# Patient Record
Sex: Male | Born: 1970
Health system: Southern US, Community
[De-identification: ages and names within clinical notes are randomized; demographics above are authoritative.]

---

## 2013-09-08 ENCOUNTER — Emergency Department (HOSPITAL_COMMUNITY)
Admission: EM | Admit: 2013-09-08 | Discharge: 2013-09-08 | Disposition: A | Discharge status: Home / Self Care | Payer: 59 | Source: Home / Self Care | Attending: Emergency Medicine | Admitting: Emergency Medicine

## 2013-09-08 ENCOUNTER — Emergency Department (INDEPENDENT_AMBULATORY_CARE_PROVIDER_SITE_OTHER): Payer: 59

## 2013-09-08 ENCOUNTER — Encounter (HOSPITAL_COMMUNITY): Payer: Self-pay | Admitting: Emergency Medicine

## 2013-09-08 DIAGNOSIS — J069 Acute upper respiratory infection, unspecified: Secondary | ICD-10-CM

## 2013-09-08 DIAGNOSIS — R030 Elevated blood-pressure reading, without diagnosis of hypertension: Secondary | ICD-10-CM

## 2013-09-08 DIAGNOSIS — IMO0001 Reserved for inherently not codable concepts without codable children: Secondary | ICD-10-CM

## 2013-09-08 MED ORDER — CETIRIZINE HCL 10 MG PO TABS: 10.0000 mg | ORAL_TABLET | Freq: Every day | ORAL | Status: AC

## 2013-09-08 MED ORDER — IPRATROPIUM BROMIDE 0.06 % NA SOLN: 2.0000 | Freq: Four times a day (QID) | NASAL | Status: AC

## 2013-09-08 MED ORDER — BENZONATATE 100 MG PO CAPS: 100.0000 mg | ORAL_CAPSULE | Freq: Three times a day (TID) | ORAL | Status: AC | PRN

## 2013-09-08 MED ORDER — PREDNISONE 10 MG PO TABS: 20.0000 mg | ORAL_TABLET | Freq: Every day | ORAL | Status: AC

## 2013-09-08 NOTE — ED Provider Notes (Signed)
Medical screening examination/treatment/procedure(s) were performed by non-physician practitioner and as supervising physician I was immediately available for consultation/collaboration.  Philipp Deputy, M.D.  Harden Mo, MD 09/08/13 2154

## 2013-09-08 NOTE — ED Provider Notes (Signed)
CSN: [id number]     Arrival date & time 09/08/13  1208 History   First MD Initiated Contact with Patient 09/08/13 1326     Chief Complaint  Patient presents with  . URI   (Consider location/radiation/quality/duration/timing/severity/associated sxs/prior Treatment) Patient is a 43 y.o. male presenting with URI. The history is provided by the patient.  URI Presenting symptoms: congestion, cough and rhinorrhea   Presenting symptoms: no ear pain, no fever and no sore throat   Presenting symptoms comment:  Nasal congestion and right ear congestion Severity:  Mild Onset quality:  Gradual Duration:  1 week Timing:  Constant Progression:  Unchanged Chronicity:  New Ineffective treatments:  OTC medications Associated symptoms: no arthralgias, no headaches, no myalgias, no neck pain, no sinus pain, no sneezing, no swollen glands and no wheezing   Risk factors: no immunosuppression, no recent illness, no recent travel and no sick contacts     History reviewed. No pertinent past medical history. No past surgical history on file. No family history on file. History  Substance Use Topics  . Smoking status: Not on file  . Smokeless tobacco: Not on file  . Alcohol Use: Not on file    Review of Systems  Constitutional: Negative for fever and chills.  HENT: Positive for congestion, postnasal drip, rhinorrhea and sinus pressure. Negative for ear pain, mouth sores, sneezing and sore throat.   Eyes: Negative.   Respiratory: Positive for cough. Negative for chest tightness, shortness of breath and wheezing.   Cardiovascular: Negative.   Gastrointestinal: Negative.   Genitourinary: Negative.   Musculoskeletal: Negative for arthralgias, myalgias and neck pain.  Skin: Negative.   Neurological: Negative for dizziness, weakness and headaches.    Allergies  Review of patient's allergies indicates no known allergies.  Home Medications   Prior to Admission medications   Medication Sig Start  Date End Date Taking? Authorizing Provider  benzonatate (TESSALON) 100 MG capsule Take 1 capsule (100 mg total) by mouth 3 (three) times daily as needed for cough. 09/08/13   Lahoma Rocker, PA  cetirizine (ZYRTEC) 10 MG tablet Take 1 tablet (10 mg total) by mouth daily. 09/08/13   Lahoma Rocker, PA  ipratropium (ATROVENT) 0.06 % nasal spray Place 2 sprays into both nostrils 4 (four) times daily. For nasal congestion 09/08/13   Lahoma Rocker, PA  predniSONE (DELTASONE) 10 MG tablet Take 2 tablets (20 mg total) by mouth daily. X 3 days 09/08/13   Annett Gula Presson, PA   BP 143/100  Pulse 74  Temp(Src) 99.1 F (37.3 C) (Oral)  Resp 14  SpO2 99% Physical Exam  Nursing note and vitals reviewed. Constitutional: He is oriented to person, place, and time. He appears well-developed and well-nourished. No distress.  HENT:  Head: Normocephalic and atraumatic.  Right Ear: Hearing, external ear and ear canal normal. No drainage, swelling or tenderness. No mastoid tenderness. Tympanic membrane is not injected, not erythematous, not retracted and not bulging. A middle ear effusion is present. No hemotympanum.  Left Ear: Hearing, external ear and ear canal normal. No drainage, swelling or tenderness. No mastoid tenderness. Tympanic membrane is not injected, not erythematous, not retracted and not bulging. A middle ear effusion is present. No hemotympanum.  Nose: Nose normal.  Mouth/Throat: Uvula is midline, oropharynx is clear and moist and mucous membranes are normal.  Trace of clear fluid behind each TM  Eyes: Conjunctivae are normal. Right eye exhibits no discharge. Left eye exhibits no discharge. No scleral icterus.  Neck: Normal range of motion. Neck supple.  Cardiovascular: Normal rate, regular rhythm and normal heart sounds.   Pulmonary/Chest: Effort normal and breath sounds normal. No respiratory distress. He has no wheezes.  Musculoskeletal: Normal range of motion.   Lymphadenopathy:    He has no cervical adenopathy.  Neurological: He is alert and oriented to person, place, and time.  Skin: Skin is warm and dry.  Psychiatric: He has a normal mood and affect. His behavior is normal.    ED Course  Procedures (including critical care time) Labs Review Labs Reviewed - No data to display  Imaging Review Dg Chest 2 View  09/08/2013   CLINICAL DATA:  Cough and congestion.  EXAM: CHEST  2 VIEW  COMPARISON:  Report 07/11/2009  FINDINGS: The heart size is normal. The lungs are mildly hyperinflated. No focal airspace disease is evident. The visualized soft tissues and bony thorax are unremarkable.  IMPRESSION: 1. Mild hyperinflation. This may be within normal limits. Reactive airways disease is also considered. 2. No focal airspace disease.   Electronically Signed   By: Lawrence Santiago M.D.   On: 09/08/2013 14:12     MDM   1. Elevated blood pressure   2. URI (upper respiratory infection)   CXR normal. Exam suggests viral URI with mild serous otitis bilaterally. Explained to patient that findings on exam and CXR do not indicate need for antibiotics and that he is simply experiencing a slightly prolonged recovery phase. Made him aware of his elevated blood pressure and the need for PCP follow up and to discontinue oral decongestants. Will provide Rx for tessalon, atrovent nasal spray, zyrtec and prednisone to help with inflammatory response to recent illness and advise follow up if no improvement over next 7-10 days.     [place], [place] 09/08/13 1531

## 2013-09-08 NOTE — ED Notes (Signed)
C/o cough and congestion for a week now States he used OTC medication States he thinks it is a sinus infection due to a cold

## 2013-09-08 NOTE — Discharge Instructions (Signed)
Your chest xray was normal and your exam does not suggest a bacterial infection or the need for antibiotics. Your chest xray was normal. Your blood pressure is quite elevated today (143/100 & 156/101) and you should discontinue the use of any oral over the counter decongestants as these medications will cause additional elevation of your blood pressure. Please contact your doctor for follow up evaluation with regard to your blood pressure. You should have this re-checked in 7-10 days. You are likely experiencing a prolonged recovery from a viral upper respiratory infection and you should use medications as prescribed to help treat your symptoms.   Antibiotic Nonuse  Your caregiver felt that the infection or problem was not one that would be helped with an antibiotic. Infections may be caused by viruses or bacteria. Only a caregiver can tell which one of these is the likely cause of an illness. A cold is the most common cause of infection in both adults and children. A cold is a virus. Antibiotic treatment will have no effect on a viral infection. Viruses can lead to many lost days of work caring for sick children and many missed days of school. Children may catch as many as 10 "colds" or "flus" per year during which they can be tearful, cranky, and uncomfortable. The goal of treating a virus is aimed at keeping the ill person comfortable. Antibiotics are medications used to help the body fight bacterial infections. There are relatively few types of bacteria that cause infections but there are hundreds of viruses. While both viruses and bacteria cause infection they are very different types of germs. A viral infection will typically go away by itself within 7 to 10 days. Bacterial infections may spread or get worse without antibiotic treatment. Examples of bacterial infections are:  Sore throats (like strep throat or tonsillitis).  Infection in the lung (pneumonia).  Ear and skin infections. Examples of  viral infections are:  Colds or flus.  Most coughs and bronchitis.  Sore throats not caused by Strep.  Runny noses. It is often best not to take an antibiotic when a viral infection is the cause of the problem. Antibiotics can kill off the helpful bacteria that we have inside our body and allow harmful bacteria to start growing. Antibiotics can cause side effects such as allergies, nausea, and diarrhea without helping to improve the symptoms of the viral infection. Additionally, repeated uses of antibiotics can cause bacteria inside of our body to become resistant. That resistance can be passed onto harmful bacterial. The next time you have an infection it may be harder to treat if antibiotics are used when they are not needed. Not treating with antibiotics allows our own immune system to develop and take care of infections more efficiently. Also, antibiotics will work better for [place] when they are prescribed for bacterial infections. Treatments for a child that is ill may include:  Give extra fluids throughout the day to stay hydrated.  Get plenty of rest.  Only give your child over-the-counter or prescription medicines for pain, discomfort, or fever as directed by your caregiver.  The use of a cool mist humidifier may help stuffy noses.  Cold medications if suggested by your caregiver. Your caregiver may decide to start you on an antibiotic if:  The problem you were seen for today continues for a longer length of time than expected.  You develop a secondary bacterial infection. SEEK MEDICAL CARE IF:  Fever lasts longer than 5 days.  Symptoms continue to get  worse after 5 to 7 days or become severe.  Difficulty in breathing develops.  Signs of dehydration develop (poor drinking, rare urinating, dark colored urine).  Changes in behavior or worsening tiredness (listlessness or lethargy). Document Released: 05/25/2001 Document Revised: 06/08/2011 Document Reviewed:  11/21/2008 [facility] Patient Information 2014 [place], [place].  Blood Pressure Record Sheet Your blood pressure on this visit to the Emergency Department or Clinic is elevated. This does not necessarily mean you have hypertension, but it does mean that your blood pressure needs to be rechecked. Many times blood pressure increases because of illness, pain, anxiety, or other factors. We recommend that you get a series of blood pressure readings done over a period of about 5 days. It is best to get a reading in the morning and one in the evening. You should make sure you sit and relax for 1 to 5 minutes before the reading is taken. Write the readings down and make a follow-up appointment with your doctor to discuss the results. If there is not a free clinic or a drug store with blood pressure taking machines near you, you can purchase good blood pressure taking equipment from a drug store, often for less than the price of a physician's office call. Having one in the home also allows you the convenience of taking your blood pressure while you are home and in relaxed surroundings. Your blood pressure in the Emergency Department or Clinic on ________ was ____________________. BLOOD PRESSURE LOG Date: _______________________  a.m. _____________________  p.m. _____________________ Date: _______________________  a.m. _____________________  p.m. _____________________ Date: _______________________  a.m. _____________________  p.m. _____________________ Date: _______________________  a.m. _____________________  p.m. _____________________ Date: _______________________  a.m. _____________________  p.m. _____________________ Document Released: 12/13/2002 Document Revised: 06/08/2011 Document Reviewed: 03/16/2005 [employer] Patient Information 2014 [place], [place].  Hypertension As your heart beats, it forces blood through your arteries. This force is your blood pressure. If the pressure is too  high, it is called hypertension (HTN) or high blood pressure. HTN is dangerous because you may have it and not know it. High blood pressure may mean that your heart has to work harder to pump blood. Your arteries may be narrow or stiff. The extra work puts you at risk for heart disease, stroke, and other problems.  Blood pressure consists of two numbers, a higher number over a lower, 110/72, for example. It is stated as "110 over 72." The ideal is below 120 for the top number (systolic) and under 80 for the bottom (diastolic). Write down your blood pressure today. You should pay close attention to your blood pressure if you have certain conditions such as:  Heart failure.  Prior heart attack.  Diabetes  Chronic kidney disease.  Prior stroke.  Multiple risk factors for heart disease. To see if you have HTN, your blood pressure should be measured while you are seated with your arm held at the level of the heart. It should be measured at least twice. A one-time elevated blood pressure reading (especially in the Emergency Department) does not mean that you need treatment. There may be conditions in which the blood pressure is different between your right and left arms. It is important to see your caregiver soon for a recheck. Most people have essential hypertension which means that there is not a specific cause. This type of high blood pressure may be lowered by changing lifestyle factors such as:  Stress.  Smoking.  Lack of exercise.  Excessive weight.  Drug/tobacco/alcohol use.  Eating less salt.  Most people do not have symptoms from high blood pressure until it has caused damage to the body. Effective treatment can often prevent, delay or reduce that damage. TREATMENT  When a cause has been identified, treatment for high blood pressure is directed at the cause. There are a large number of medications to treat HTN. These fall into several categories, and your caregiver will help you  select the medicines that are best for you. Medications may have side effects. You should review side effects with your caregiver. If your blood pressure stays high after you have made lifestyle changes or started on medicines,   Your medication(s) may need to be changed.  Other problems may need to be addressed.  Be certain you understand your prescriptions, and know how and when to take your medicine.  Be sure to follow up with your caregiver within the time frame advised (usually within two weeks) to have your blood pressure rechecked and to review your medications.  If you are taking more than one medicine to lower your blood pressure, make sure you know how and at what times they should be taken. Taking two medicines at the same time can result in blood pressure that is too low. SEEK IMMEDIATE MEDICAL CARE IF:  You develop a severe headache, blurred or changing vision, or confusion.  You have unusual weakness or numbness, or a faint feeling.  You have severe chest or abdominal pain, vomiting, or breathing problems. MAKE SURE YOU:   Understand these instructions.  Will watch your condition.  Will get help right away if you are not doing well or get worse. Document Released: 03/16/2005 Document Revised: 06/08/2011 Document Reviewed: 11/04/2007 [facility] Patient Information 2014 [place].  Upper Respiratory Infection, Adult An upper respiratory infection (URI) is also sometimes known as the common cold. The upper respiratory tract includes the nose, sinuses, throat, trachea, and bronchi. Bronchi are the airways leading to the lungs. Most people improve within 1 week, but symptoms can last up to 2 weeks. A residual cough may last even longer.  CAUSES Many different viruses can infect the tissues lining the upper respiratory tract. The tissues become irritated and inflamed and often become very moist. Mucus production is also common. A cold is contagious. You can easily spread  the virus to others by oral contact. This includes kissing, sharing a glass, coughing, or sneezing. Touching your mouth or nose and then touching a surface, which is then touched by another person, can also spread the virus. SYMPTOMS  Symptoms typically develop 1 to 3 days after you come in contact with a cold virus. Symptoms vary from person to person. They may include:  Runny nose.  Sneezing.  Nasal congestion.  Sinus irritation.  Sore throat.  Loss of voice (laryngitis).  Cough.  Fatigue.  Muscle aches.  Loss of appetite.  Headache.  Low-grade fever. DIAGNOSIS  You might diagnose your own cold based on familiar symptoms, since most people get a cold 2 to 3 times a year. Your caregiver can confirm this based on your exam. Most importantly, your caregiver can check that your symptoms are not due to another disease such as strep throat, sinusitis, pneumonia, asthma, or epiglottitis. Blood tests, throat tests, and X-rays are not necessary to diagnose a common cold, but they may sometimes be helpful in excluding other more serious diseases. Your caregiver will decide if any further tests are required. RISKS AND COMPLICATIONS  You may be at risk for a more severe case of the  common cold if you smoke cigarettes, have chronic heart disease (such as heart failure) or lung disease (such as asthma), or if you have a weakened immune system. The very young and very old are also at risk for more serious infections. Bacterial sinusitis, middle ear infections, and bacterial pneumonia can complicate the common cold. The common cold can worsen asthma and chronic obstructive pulmonary disease (COPD). Sometimes, these complications can require emergency medical care and may be life-threatening. PREVENTION  The best way to protect against getting a cold is to practice good hygiene. Avoid oral or hand contact with people with cold symptoms. Wash your hands often if contact occurs. There is no clear  evidence that vitamin C, vitamin E, echinacea, or exercise reduces the chance of developing a cold. However, it is always recommended to get plenty of rest and practice good nutrition. TREATMENT  Treatment is directed at relieving symptoms. There is no cure. Antibiotics are not effective, because the infection is caused by a virus, not by bacteria. Treatment may include:  Increased fluid intake. Sports drinks offer valuable electrolytes, sugars, and fluids.  Breathing heated mist or steam (vaporizer or shower).  Eating chicken soup or other clear broths, and maintaining good nutrition.  Getting plenty of rest.  Using gargles or lozenges for comfort.  Controlling fevers with ibuprofen or acetaminophen as directed by your caregiver.  Increasing usage of your inhaler if you have asthma. Zinc gel and zinc lozenges, taken in the first 24 hours of the common cold, can shorten the duration and lessen the severity of symptoms. Pain medicines may help with fever, muscle aches, and throat pain. A variety of non-prescription medicines are available to treat congestion and runny nose. Your caregiver can make recommendations and may suggest nasal or lung inhalers for other symptoms.  HOME CARE INSTRUCTIONS   Only take over-the-counter or prescription medicines for pain, discomfort, or fever as directed by your caregiver.  Use a warm mist humidifier or inhale steam from a shower to increase air moisture. This may keep secretions moist and make it easier to breathe.  Drink enough water and fluids to keep your urine clear or pale yellow.  Rest as needed.  Return to work when your temperature has returned to normal or as your caregiver advises. You may need to stay home longer to avoid infecting others. You can also use a face mask and careful hand washing to prevent spread of the virus. SEEK MEDICAL CARE IF:   After the first few days, you feel you are getting worse rather than better.  You need  your caregiver's advice about medicines to control symptoms.  You develop chills, worsening shortness of breath, or brown or red sputum. These may be signs of pneumonia.  You develop yellow or brown nasal discharge or pain in the face, especially when you bend forward. These may be signs of sinusitis.  You develop a fever, swollen neck glands, pain with swallowing, or white areas in the back of your throat. These may be signs of strep throat. SEEK IMMEDIATE MEDICAL CARE IF:   You have a fever.  You develop severe or persistent headache, ear pain, sinus pain, or chest pain.  You develop wheezing, a prolonged cough, cough up blood, or have a change in your usual mucus (if you have chronic lung disease).  You develop sore muscles or a stiff neck. Document Released: 09/09/2000 Document Revised: 06/08/2011 Document Reviewed: 07/18/2010 [facility] Patient Information 2014 [place], [place].

## 2014-04-26 ENCOUNTER — Other Ambulatory Visit (HOSPITAL_COMMUNITY): Payer: Self-pay | Admitting: Physician Assistant

## 2014-04-26 DIAGNOSIS — C6292 Malignant neoplasm of left testis, unspecified whether descended or undescended: Secondary | ICD-10-CM

## 2014-04-27 ENCOUNTER — Other Ambulatory Visit (HOSPITAL_COMMUNITY): Payer: Self-pay | Admitting: Physician Assistant

## 2014-04-27 DIAGNOSIS — C6292 Malignant neoplasm of left testis, unspecified whether descended or undescended: Secondary | ICD-10-CM

## 2014-05-09 ENCOUNTER — Ambulatory Visit (HOSPITAL_COMMUNITY): Payer: 59

## 2014-05-16 ENCOUNTER — Ambulatory Visit (HOSPITAL_COMMUNITY)
Admission: RE | Admit: 2014-05-16 | Discharge: 2014-05-16 | Disposition: A | Discharge status: Home / Self Care | Payer: 59 | Source: Ambulatory Visit | Attending: *Deleted | Admitting: *Deleted

## 2014-05-16 ENCOUNTER — Ambulatory Visit (HOSPITAL_COMMUNITY): Payer: 59

## 2014-05-16 ENCOUNTER — Ambulatory Visit (HOSPITAL_COMMUNITY)
Admission: RE | Admit: 2014-05-16 | Discharge: 2014-05-16 | Disposition: A | Discharge status: Home / Self Care | Payer: 59 | Source: Ambulatory Visit | Attending: Physician Assistant | Admitting: Physician Assistant

## 2014-05-16 DIAGNOSIS — C6292 Malignant neoplasm of left testis, unspecified whether descended or undescended: Secondary | ICD-10-CM | POA: Insufficient documentation

## 2014-05-16 DIAGNOSIS — Z9221 Personal history of antineoplastic chemotherapy: Secondary | ICD-10-CM | POA: Insufficient documentation

## 2014-05-16 MED ORDER — GADOBENATE DIMEGLUMINE 529 MG/ML IV SOLN
20.0000 mL | Freq: Once | INTRAVENOUS | Status: AC
  Administered 2014-05-16: 18 mL via INTRAVENOUS

## 2015-03-13 ENCOUNTER — Other Ambulatory Visit (HOSPITAL_COMMUNITY): Payer: Self-pay | Admitting: Physician Assistant

## 2015-03-13 DIAGNOSIS — C6292 Malignant neoplasm of left testis, unspecified whether descended or undescended: Secondary | ICD-10-CM

## 2015-03-27 ENCOUNTER — Ambulatory Visit (HOSPITAL_COMMUNITY): Payer: 59

## 2015-03-27 ENCOUNTER — Ambulatory Visit (HOSPITAL_COMMUNITY)
Admission: RE | Admit: 2015-03-27 | Discharge: 2015-03-27 | Disposition: A | Discharge status: Home / Self Care | Payer: 59 | Source: Ambulatory Visit | Attending: Physician Assistant | Admitting: Physician Assistant

## 2015-03-27 DIAGNOSIS — C6292 Malignant neoplasm of left testis, unspecified whether descended or undescended: Secondary | ICD-10-CM | POA: Insufficient documentation

## 2015-03-27 MED ORDER — GADOBENATE DIMEGLUMINE 529 MG/ML IV SOLN
20.0000 mL | Freq: Once | INTRAVENOUS | Status: AC | PRN
  Administered 2015-03-27: 18 mL via INTRAVENOUS

## 2015-04-02 DIAGNOSIS — C6292 Malignant neoplasm of left testis, unspecified whether descended or undescended: Secondary | ICD-10-CM | POA: Diagnosis not present

## 2015-04-02 DIAGNOSIS — Z9079 Acquired absence of other genital organ(s): Secondary | ICD-10-CM | POA: Diagnosis not present

## 2015-04-02 DIAGNOSIS — Z8547 Personal history of malignant neoplasm of testis: Secondary | ICD-10-CM | POA: Diagnosis not present

## 2015-04-02 DIAGNOSIS — Z08 Encounter for follow-up examination after completed treatment for malignant neoplasm: Secondary | ICD-10-CM | POA: Diagnosis not present

## 2016-04-02 DIAGNOSIS — I10 Essential (primary) hypertension: Secondary | ICD-10-CM | POA: Diagnosis not present

## 2016-04-02 DIAGNOSIS — Z08 Encounter for follow-up examination after completed treatment for malignant neoplasm: Secondary | ICD-10-CM | POA: Diagnosis not present

## 2016-04-02 DIAGNOSIS — Z9221 Personal history of antineoplastic chemotherapy: Secondary | ICD-10-CM | POA: Diagnosis not present

## 2016-04-02 DIAGNOSIS — Z8547 Personal history of malignant neoplasm of testis: Secondary | ICD-10-CM | POA: Diagnosis not present

## 2016-05-10 IMAGING — MR MR PELVIS WO/W CM
11 of 20 series · 20 of 48 positions shown · IV contrast (multihance)
Comparison: 05/16/2014

CLINICAL DATA: Followup left testicular seminoma. Previous left
orchiectomy and chemotherapy.

EXAM:
MRI ABDOMEN AND PELVIS WITHOUT AND WITH CONTRAST
TECHNIQUE: Multiplanar multisequence MR imaging of the abdomen and pelvis was
performed both before and after the administration of intravenous
contrast.
CONTRAST:  18mL MULTIHANCE GADOBENATE DIMEGLUMINE 529 MG/ML IV SOLN

[Series 2: cor ssfse pelvis · coronal · 6.0mm · 0.78mm/px · 1 of 31 slices shown]
[im 1/31]
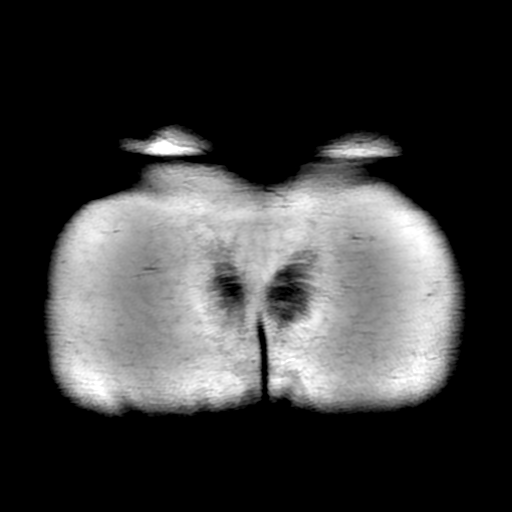

[Series 3: T2 · axial · 5.5mm · 1.25mm/px · 1 of 42 slices shown (1 of 2)]
[im 1/42]
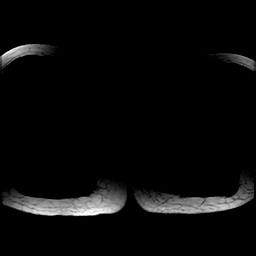

[Series 4: T2 · sagittal · 5.5mm · 1.17mm/px · 1 of 51 slices shown (2 of 2)]
[im 1/51]
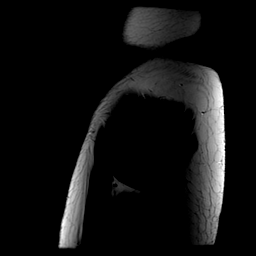

[Series 6: T1 fat-sat · axial · 5.5mm · 0.62mm/px · z∈[-299,-53]mm · 2 of 42 slices shown]
[im 1/42]
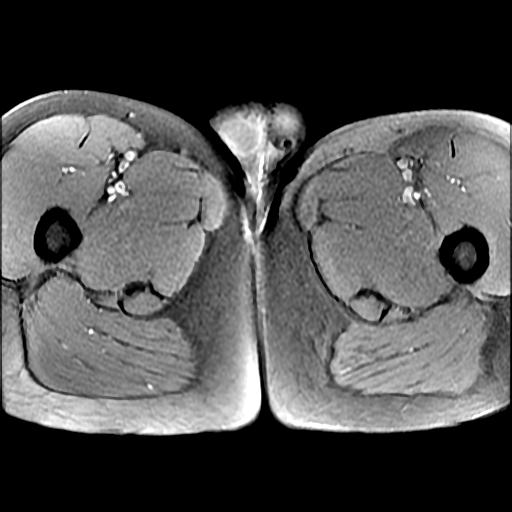
[im 42/42]
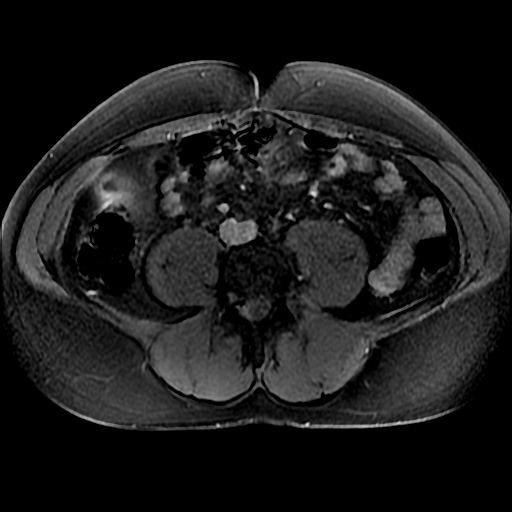

[Series 11: cor ssfse abd · coronal · 6.0mm · 0.78mm/px · 1 of 29 slices shown]
[im 1/29]
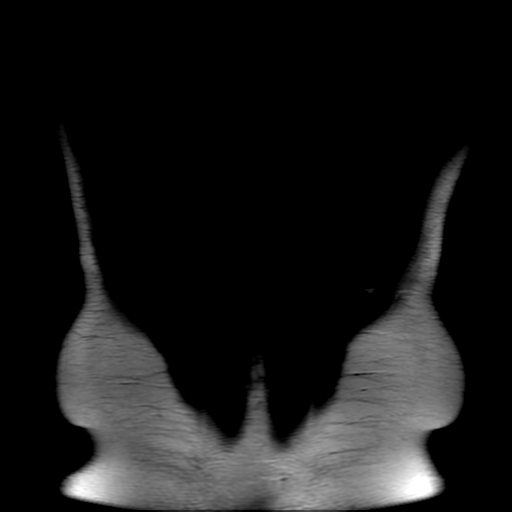

[Series 12: T2 fat-sat · axial · 8.0mm · 0.74mm/px · 1 of 26 slices shown]
[im 1/26]
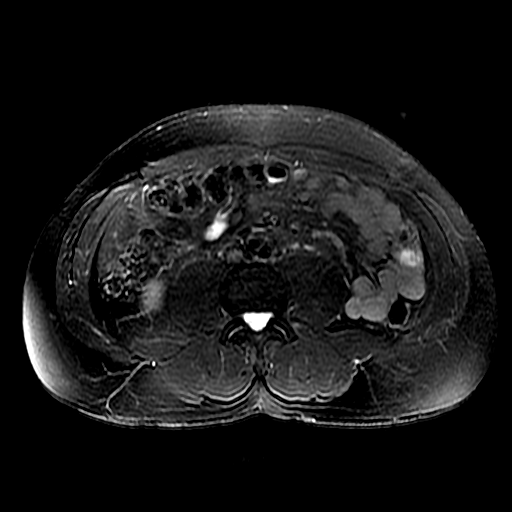

[Series 14: bSSFP · axial · 4.0mm · 0.74mm/px · z∈[-23,+189]mm · 3 of 54 slices shown]
[im 1/54]
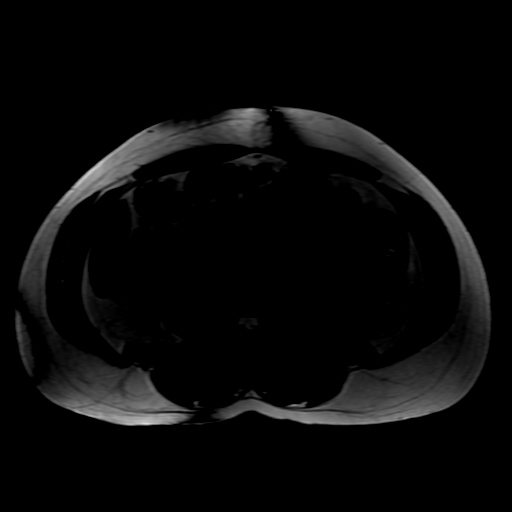
[im 27/54]
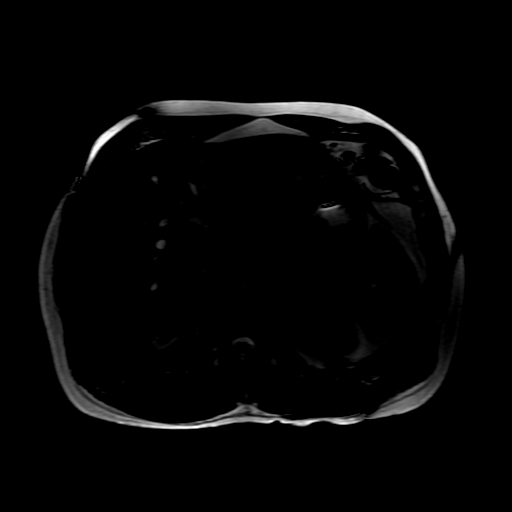
[im 54/54]
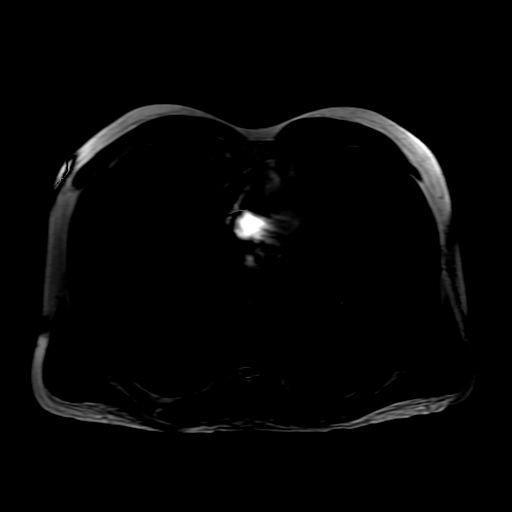

[Series 15: T1 dynamic · axial · 4.0mm · 0.74mm/px · z∈[-48,+188]mm · 3 of 60 slices shown]
[im 1/60]
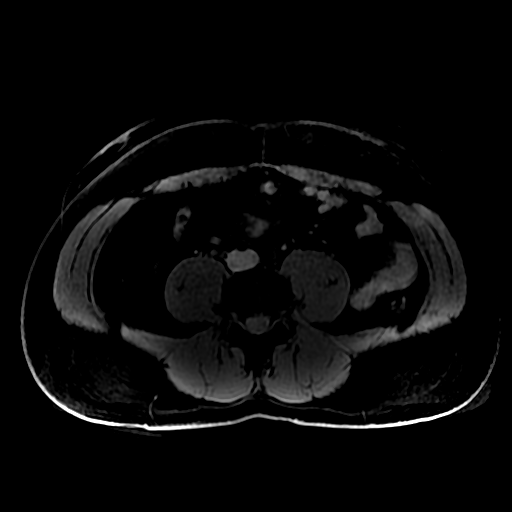
[im 30/60]
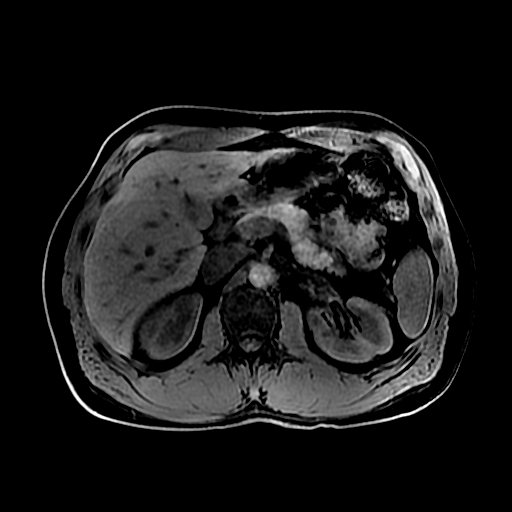
[im 60/60]
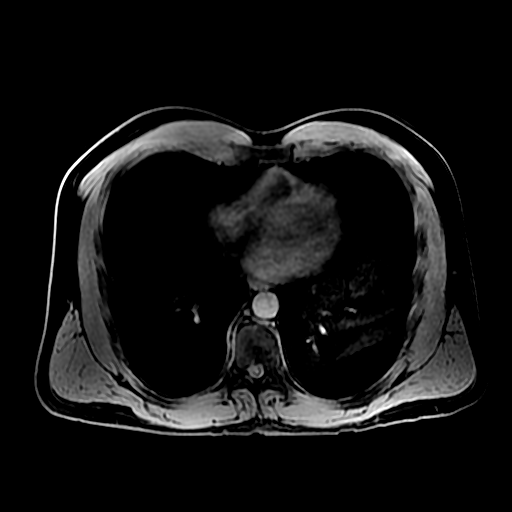

[Series 18: T1 fat-sat post-contrast · axial · 5.5mm · 0.62mm/px · z∈[-299,-53]mm · 2 of 42 slices shown]
[im 1/42]
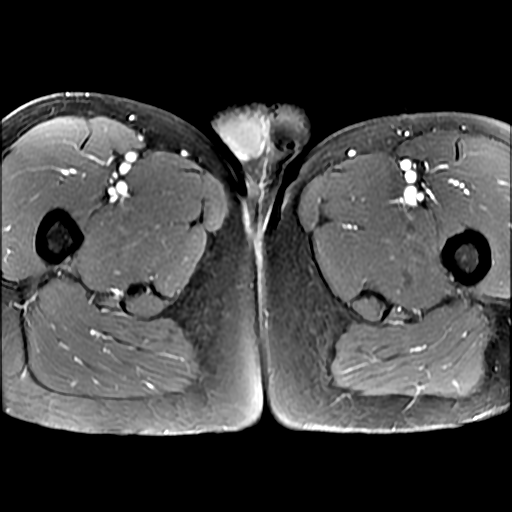
[im 42/42]
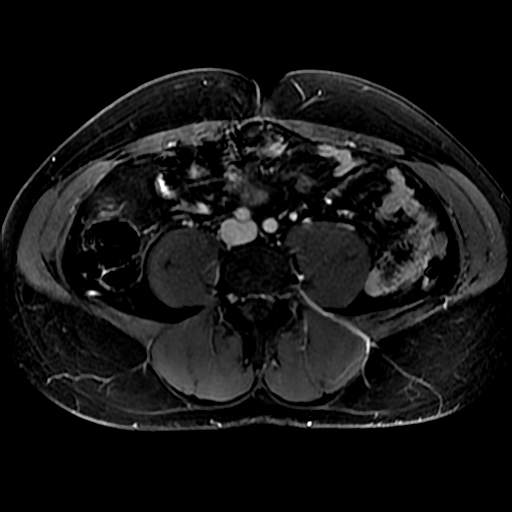

[Series 19: T1 dynamic post-contrast · axial · 4.0mm · 0.74mm/px · z∈[-48,+188]mm · 3 of 60 slices shown]
[im 1/60]
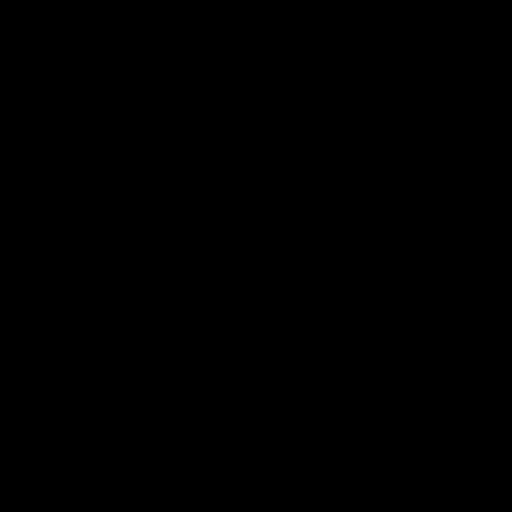
[im 30/60]
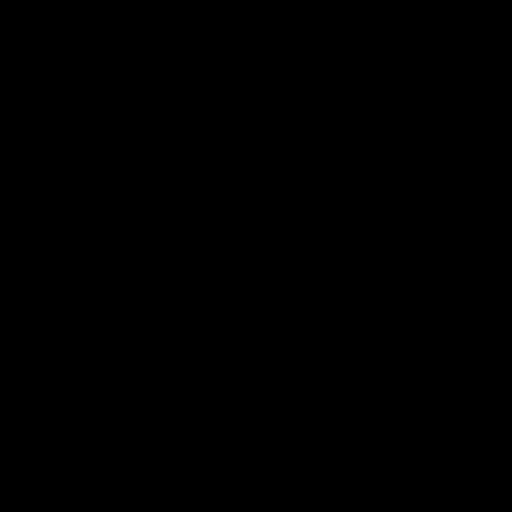
[im 60/60]
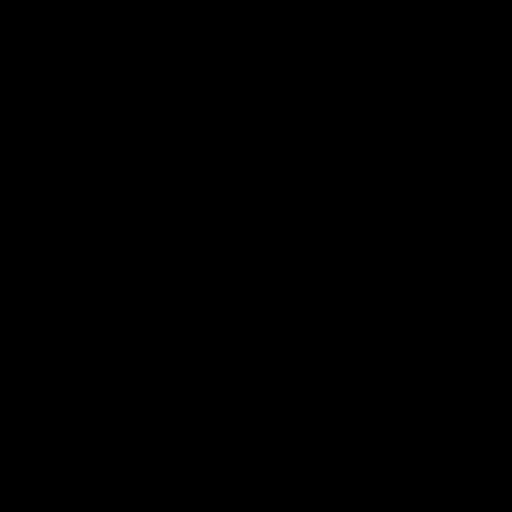

[Series 21: ax ssfse abd · axial · 4.0mm · 0.74mm/px · z∈[-14,+94]mm · 2 of 56 slices shown]
[im 1/56]
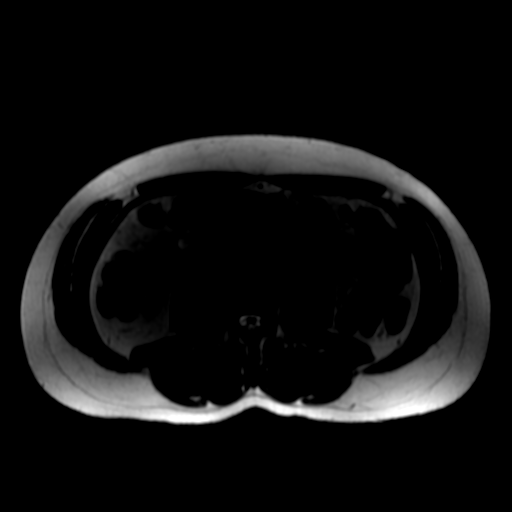
[im 28/56]
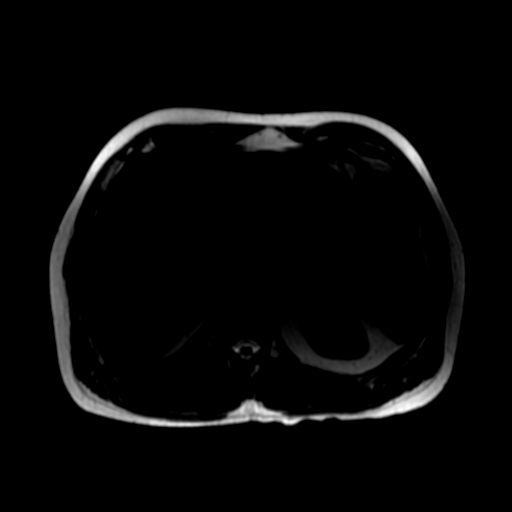

[20 of 48 positions shown; findings below may reference images not displayed]

FINDINGS: MRI ABDOMEN FINDINGS

Lower chest:  No acute findings.

Hepatobiliary: No mass or other parenchymal abnormality identified.
Gallbladder is unremarkable.

Pancreas: No mass, inflammatory changes, or other parenchymal
abnormality identified.

Spleen:  Within normal limits in size and appearance.

Adrenals/Urinary Tract: No masses identified. A tiny simple right
renal cyst again noted. No evidence of hydronephrosis.

Stomach/Bowel: Visualized portions within the abdomen are
unremarkable.

Vascular/Lymphatic: No pathologically enlarged lymph nodes
identified in the retroperitoneum or elsewhere within the abdomen.
No abdominal aortic aneurysm demonstrated.

Other:  None.

Musculoskeletal:  No suspicious bone lesions identified.

MRI PELVIS FINDINGS

Prior left orchiectomy and left testicular prosthesis again noted.
No evidence of pelvic lymphadenopathy or other soft tissue masses.

Prostate and urinary bladder are unremarkable in appearance. Pelvic
bowel loops are also unremarkable. No evidence of inflammatory
process or abnormal fluid collections. No suspicious pelvic bone
lesions identified.
IMPRESSION: Stable exam. No evidence of metastatic disease or other significant
abnormality within the abdomen or pelvis.

## 2016-06-01 DIAGNOSIS — R000 Tachycardia, unspecified: Secondary | ICD-10-CM | POA: Diagnosis not present

## 2016-06-01 DIAGNOSIS — Z23 Encounter for immunization: Secondary | ICD-10-CM | POA: Diagnosis not present

## 2016-06-01 DIAGNOSIS — I10 Essential (primary) hypertension: Secondary | ICD-10-CM | POA: Diagnosis not present

## 2016-06-01 DIAGNOSIS — Z8547 Personal history of malignant neoplasm of testis: Secondary | ICD-10-CM | POA: Diagnosis not present

## 2016-06-15 DIAGNOSIS — B349 Viral infection, unspecified: Secondary | ICD-10-CM | POA: Diagnosis not present

## 2016-06-15 DIAGNOSIS — J029 Acute pharyngitis, unspecified: Secondary | ICD-10-CM | POA: Diagnosis not present

## 2016-08-03 DIAGNOSIS — I10 Essential (primary) hypertension: Secondary | ICD-10-CM | POA: Diagnosis not present

## 2016-08-03 DIAGNOSIS — R000 Tachycardia, unspecified: Secondary | ICD-10-CM | POA: Diagnosis not present

## 2016-08-10 DIAGNOSIS — I10 Essential (primary) hypertension: Secondary | ICD-10-CM | POA: Diagnosis not present

## 2016-10-07 MED FILL — LISINOPRIL 10 MG TABLET: 10 | 90 days supply | Qty: 90 | Fill #0

## 2017-01-12 MED FILL — LISINOPRIL 10 MG TABS: 10 | 90 days supply | Qty: 90 | Fill #1

## 2017-04-06 DIAGNOSIS — Z79899 Other long term (current) drug therapy: Secondary | ICD-10-CM | POA: Diagnosis not present

## 2017-04-06 DIAGNOSIS — Z8547 Personal history of malignant neoplasm of testis: Secondary | ICD-10-CM | POA: Diagnosis not present

## 2017-04-06 DIAGNOSIS — Z09 Encounter for follow-up examination after completed treatment for conditions other than malignant neoplasm: Secondary | ICD-10-CM | POA: Diagnosis not present

## 2017-04-06 DIAGNOSIS — I10 Essential (primary) hypertension: Secondary | ICD-10-CM | POA: Diagnosis not present

## 2017-04-20 MED FILL — LISINOPRIL 10 MG TABS: 10 | 90 days supply | Qty: 90 | Fill #2

## 2017-04-22 DIAGNOSIS — I10 Essential (primary) hypertension: Secondary | ICD-10-CM | POA: Diagnosis not present

## 2017-04-27 DIAGNOSIS — Z0000 Encounter for general adult medical examination without abnormal findings: Secondary | ICD-10-CM | POA: Diagnosis not present

## 2017-06-01 DIAGNOSIS — L821 Other seborrheic keratosis: Secondary | ICD-10-CM | POA: Diagnosis not present

## 2017-06-22 DIAGNOSIS — J029 Acute pharyngitis, unspecified: Secondary | ICD-10-CM | POA: Diagnosis not present

## 2017-06-22 DIAGNOSIS — R0982 Postnasal drip: Secondary | ICD-10-CM | POA: Diagnosis not present

## 2017-06-28 DIAGNOSIS — Z8547 Personal history of malignant neoplasm of testis: Secondary | ICD-10-CM | POA: Diagnosis not present

## 2017-07-21 MED FILL — LISINOPRIL 10 MG TABLET: 10 | 90 days supply | Qty: 90 | Fill #3

## 2017-10-15 MED FILL — LISINOPRIL 10 MG TABLET: 10 | 30 days supply | Qty: 30 | Fill #0

## 2017-11-08 MED FILL — LISINOPRIL 10 MG TABLET: 10 | 90 days supply | Qty: 90 | Fill #0

## 2018-02-09 MED FILL — LISINOPRIL 10 MG TABS: 10 | 90 days supply | Qty: 90 | Fill #1

## 2018-03-25 DIAGNOSIS — Z8547 Personal history of malignant neoplasm of testis: Secondary | ICD-10-CM | POA: Diagnosis not present

## 2018-04-05 DIAGNOSIS — Z8547 Personal history of malignant neoplasm of testis: Secondary | ICD-10-CM | POA: Diagnosis not present

## 2018-04-05 DIAGNOSIS — I10 Essential (primary) hypertension: Secondary | ICD-10-CM | POA: Diagnosis not present

## 2018-05-04 DIAGNOSIS — C6292 Malignant neoplasm of left testis, unspecified whether descended or undescended: Secondary | ICD-10-CM | POA: Diagnosis not present

## 2018-05-18 MED FILL — LISINOPRIL 10 MG TABS: 10 | 90 days supply | Qty: 90 | Fill #0 | Status: TO

## 2018-05-25 DIAGNOSIS — Z0000 Encounter for general adult medical examination without abnormal findings: Secondary | ICD-10-CM | POA: Diagnosis not present

## 2018-07-27 MED FILL — LISINOPRIL 10 MG TABLET: 10 | 90 days supply | Qty: 90 | Fill #0

## 2018-10-21 MED FILL — LISINOPRIL 10 MG TABS: 10 | 90 days supply | Qty: 90 | Fill #0

## 2019-02-02 MED FILL — LISINOPRIL 10 MG TABS: 10 | 90 days supply | Qty: 90 | Fill #1

## 2019-05-04 MED FILL — LISINOPRIL 10 MG TABS: 10 | 90 days supply | Qty: 90 | Fill #0

## 2019-06-12 DIAGNOSIS — Z0000 Encounter for general adult medical examination without abnormal findings: Secondary | ICD-10-CM | POA: Diagnosis not present

## 2019-06-16 DIAGNOSIS — Z0000 Encounter for general adult medical examination without abnormal findings: Secondary | ICD-10-CM | POA: Diagnosis not present

## 2019-06-16 DIAGNOSIS — E782 Mixed hyperlipidemia: Secondary | ICD-10-CM | POA: Diagnosis not present

## 2019-08-24 MED FILL — LISINOPRIL 10 MG TABS: 10 | 90 days supply | Qty: 90 | Fill #1

## 2019-11-14 DIAGNOSIS — Z20828 Contact with and (suspected) exposure to other viral communicable diseases: Secondary | ICD-10-CM | POA: Diagnosis not present

## 2019-11-28 MED FILL — LISINOPRIL 10 MG TABS: 10 | 90 days supply | Qty: 90 | Fill #0

## 2019-12-13 DIAGNOSIS — E782 Mixed hyperlipidemia: Secondary | ICD-10-CM | POA: Diagnosis not present
